# Patient Record
Sex: Male | Born: 1995 | Race: White | Hispanic: No | Marital: Single | Smoking: Never smoker
Health system: Southern US, Community
[De-identification: ages and names within clinical notes are randomized; demographics above are authoritative.]

---
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
## 2015-12-28 ENCOUNTER — Emergency Department: Payer: BLUE CROSS/BLUE SHIELD

## 2015-12-28 ENCOUNTER — Encounter: Payer: Self-pay | Admitting: Emergency Medicine

## 2015-12-28 ENCOUNTER — Emergency Department
Admission: EM | Admit: 2015-12-28 | Discharge: 2015-12-28 | Disposition: A | Discharge status: Home / Self Care | Payer: BLUE CROSS/BLUE SHIELD | Attending: Emergency Medicine | Admitting: Emergency Medicine

## 2015-12-28 DIAGNOSIS — M25571 Pain in right ankle and joints of right foot: Secondary | ICD-10-CM | POA: Insufficient documentation

## 2015-12-28 NOTE — ED Notes (Signed)
States he developed pain to post right ankle about 2 weeks ago  Denies injury  No swelling or redness noted. Ambulates well to treatment room. Describes pain as sharp and lasting only a short time

## 2015-12-28 NOTE — Discharge Instructions (Signed)
Advised elastic support and [facility] if condition persists.

## 2015-12-28 NOTE — ED Provider Notes (Signed)
[facility] Emergency Department Provider Note   ____________________________________________  Time seen: Approximately 10:01 AM  I have reviewed the triage vital signs and the nursing notes.   HISTORY  Chief Complaint Ankle Pain    HPI Patricia NettleMikel Nowland is a 20 y.o. male patient complaining of right ankle pain for 2 weeks. Patient denies any specific injury. Patient stated pain comes and go. Patient patient states the pain increase ambulation. No palliative measures taken for this complaint. Patient rates his pain as a 6/10. Patient describes pain as intermittent "dull".   History reviewed. No pertinent past medical history.  There are no active problems to display for this patient.   History reviewed. No pertinent past surgical history.  No current outpatient prescriptions on file.  Allergies Review of patient's allergies indicates no known allergies.  History reviewed. No pertinent family history.  Social History Social History  Substance Use Topics  . Smoking status: Never Smoker   . Smokeless tobacco: None  . Alcohol Use: No    Review of Systems Constitutional: No fever/chills Eyes: No visual changes. ENT: No sore throat. Cardiovascular: Denies chest pain. Respiratory: Denies shortness of breath. Gastrointestinal: No abdominal pain.  No nausea, no vomiting.  No diarrhea.  No constipation. Genitourinary: Negative for dysuria. Musculoskeletal: Right ankle pain  Skin: Negative for rash. Neurological: Negative for headaches, focal weakness or numbness.  ve.  ____________________________________________   PHYSICAL EXAM:  VITAL SIGNS: ED Triage Vitals  Enc Vitals Group     BP 12/28/15 0840 111/70 mmHg     Pulse Rate 12/28/15 0840 76     Resp 12/28/15 0840 20     Temp 12/28/15 0840 98.3 F (36.8 C)     Temp Source 12/28/15 0840 Oral     SpO2 12/28/15 0840 98 %     Weight 12/28/15 0840 160 lb (72.576 kg)     Height 12/28/15  0840 5\' 7"  (1.702 m)     Head Cir --      Peak Flow --      Pain Score 12/28/15 0842 6     Pain Loc --      Pain Edu? --      Excl. in GC? --     Constitutional: Alert and oriented. Well appearing and in no acute distress. Eyes: Conjunctivae are normal. PERRL. EOMI. Head: Atraumatic. Nose: No congestion/rhinnorhea. Mouth/Throat: Mucous membranes are moist.  Oropharynx non-erythematous. Neck: No stridor.  No cervical spine tenderness to palpation. Hematological/Lymphatic/Immunilogical: No cervical lymphadenopathy. Cardiovascular: Normal rate, regular rhythm. Grossly normal heart sounds.  Good peripheral circulation. Respiratory: Normal respiratory effort.  No retractions. Lungs CTAB. Gastrointestinal: Soft and nontender. No distention. No abdominal bruits. No CVA tenderness. Musculoskeletal: No obvious deformity of the left ankle. Patient has a large arch. There is no edema or ecchymosis or abrasion. Patient has some moderate guarding palpation of the distal fibular. Neurologic:  Normal speech and language. No gross focal neurologic deficits are appreciated. No gait instability. Skin:  Skin is warm, dry and intact. No rash noted. Psychiatric: Mood and affect are normal. Speech and behavior are normal.  ____________________________________________   LABS (all labs ordered are listed, but only abnormal results are displayed)  Labs Reviewed - No data to display ____________________________________________  EKG   ____________________________________________  RADIOLOGY  No acute findings on x-ray. I, Joni Reiningonald K Chaunce Winkels, personally viewed and evaluated these images (plain radiographs) as part of my medical decision making, as well as reviewing the written report by the radiologist.  ____________________________________________   PROCEDURES  Procedure(s) performed: None  Critical Care performed: No  ____________________________________________   INITIAL IMPRESSION / ASSESSMENT  AND PLAN / ED COURSE  Pertinent labs & imaging results that were available during my care of the patient were reviewed by me and considered in my medical decision making (see chart for details).  Right ankle pain. Discussed negative x-ray finding with patient. Advised patient to purchase and wear an over-the-counter elastic ankle support. Advised [facility]. ____________________________________________   FINAL CLINICAL IMPRESSION(S) / ED DIAGNOSES  Final diagnoses:  Acute right ankle pain      NEW MEDICATIONS STARTED DURING THIS VISIT:  New Prescriptions   No medications on file     Note:  This document was prepared using Dragon voice recognition software and may include unintentional dictation errors.    Joni Reining, PA-C 12/28/15 1006  Governor Rooks, MD 12/28/15 1350

## 2015-12-28 NOTE — ED Notes (Signed)
Pt reports ankle pain x 2 weeks intermittently. Pt denies injury or trauma. Pt walks with steady gait, no difficulty noted.

## 2017-10-16 IMAGING — DX DG ANKLE COMPLETE 3+V*R*
3 series · 3 of 3 positions shown · non-contrast
Comparison: None.

CLINICAL DATA: Intermittent right ankle pain for 1-2 months.
Plantar foot pain. No known injury. Initial encounter.

EXAM:
RIGHT ANKLE - COMPLETE 3+ VIEW

[ankle ap]
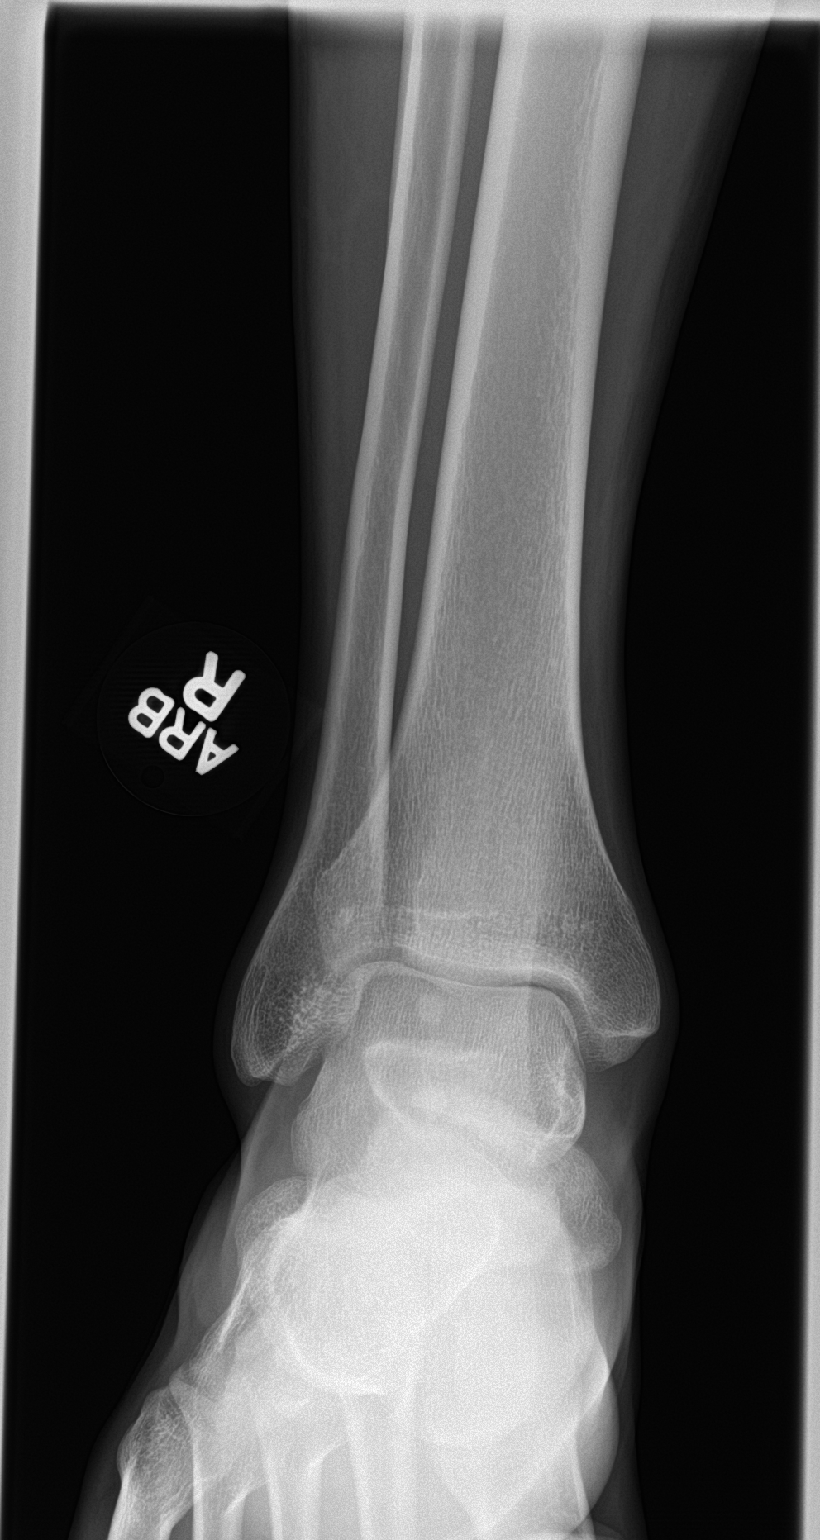

[ankle obl]
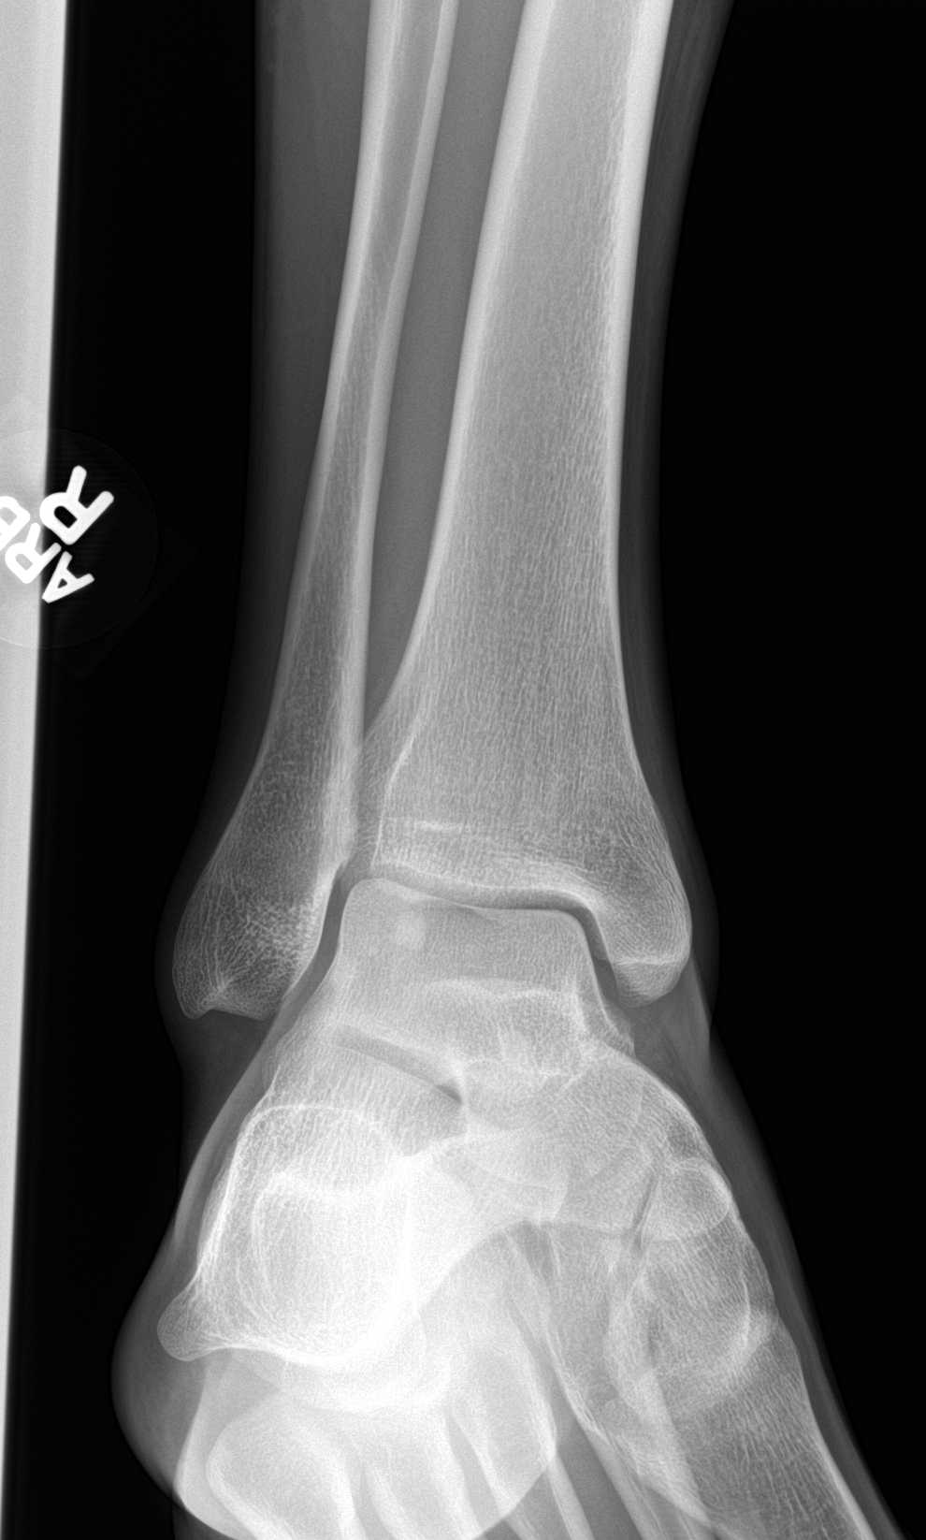

[ankle lat]
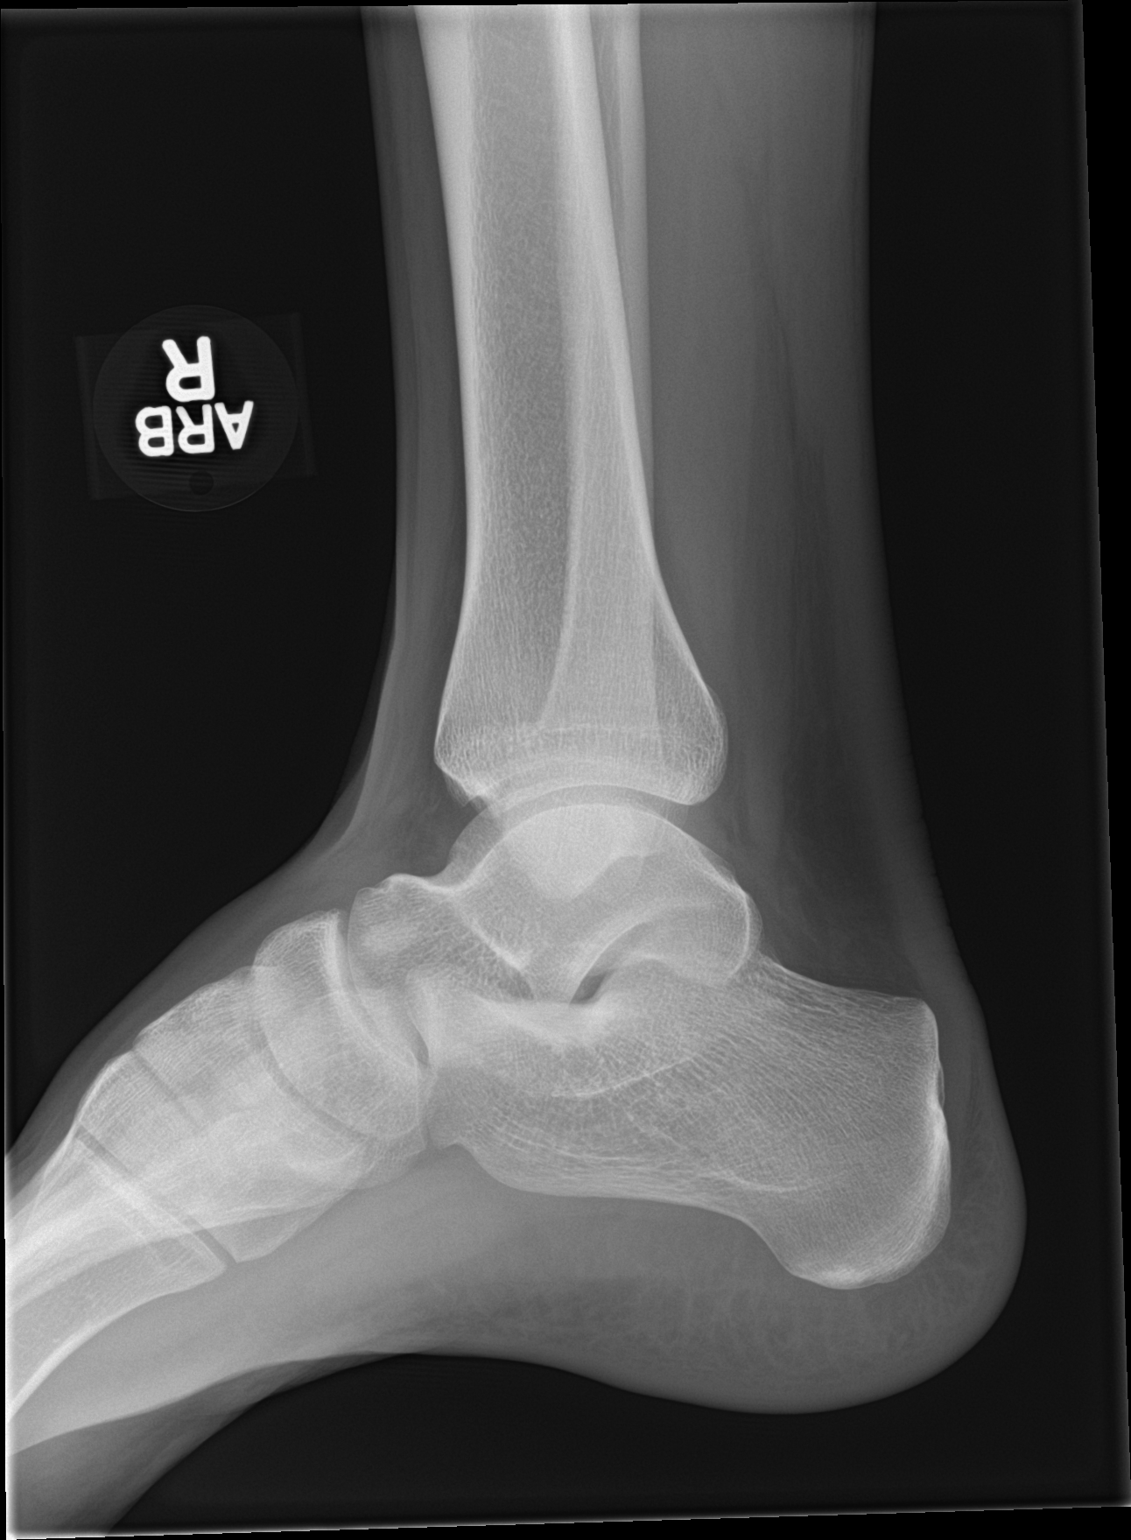

[3 of 3 positions shown; findings below may reference images not displayed]

FINDINGS: There is no evidence of fracture, dislocation, or joint effusion.
There is no evidence of arthropathy. Small sclerotic lesion in the
lateral talus most consistent with a bone island. Soft tissues are
unremarkable.
IMPRESSION: Negative exam.

## 2022-05-17 ENCOUNTER — Encounter: Payer: Self-pay | Admitting: Urology

## 2022-05-17 ENCOUNTER — Ambulatory Visit (INDEPENDENT_AMBULATORY_CARE_PROVIDER_SITE_OTHER): Payer: Self-pay | Admitting: Urology

## 2022-05-17 ENCOUNTER — Other Ambulatory Visit: Payer: Self-pay | Admitting: *Deleted

## 2022-05-17 ENCOUNTER — Other Ambulatory Visit
Admission: RE | Admit: 2022-05-17 | Discharge: 2022-05-17 | Disposition: A | Discharge status: Home / Self Care | Payer: BLUE CROSS/BLUE SHIELD | Attending: Urology | Admitting: Urology

## 2022-05-17 VITALS — BP 113/76 | HR 78 | Ht 68.0 in | Wt 152.0 lb

## 2022-05-17 DIAGNOSIS — N410 Acute prostatitis: Secondary | ICD-10-CM

## 2022-05-17 DIAGNOSIS — R361 Hematospermia: Secondary | ICD-10-CM

## 2022-05-17 DIAGNOSIS — R102 Pelvic and perineal pain: Secondary | ICD-10-CM

## 2022-05-17 LAB — URINALYSIS, COMPLETE (UACMP) WITH MICROSCOPIC
Bacteria, UA: NONE SEEN
Bilirubin Urine: NEGATIVE
Glucose, UA: NEGATIVE mg/dL
Hgb urine dipstick: NEGATIVE
Ketones, ur: NEGATIVE mg/dL
Leukocytes,Ua: NEGATIVE
Nitrite: NEGATIVE
Protein, ur: NEGATIVE mg/dL
RBC / HPF: NONE SEEN RBC/hpf (ref 0–5)
Specific Gravity, Urine: 1.01 (ref 1.005–1.030)
Squamous Epithelial / HPF: NONE SEEN (ref 0–5)
WBC, UA: NONE SEEN WBC/hpf (ref 0–5)
pH: 7 (ref 5.0–8.0)

## 2022-05-17 MED ORDER — CELECOXIB 200 MG PO CAPS: 200.0000 mg | ORAL_CAPSULE | Freq: Two times a day (BID) | ORAL | 0 refills | Status: AC

## 2022-05-17 NOTE — Progress Notes (Signed)
   05/17/22 10:37 AM   Levada Dy Sabra Heck 08/20/1996 [id number]  CC: Left groin pain, hematospermia, possible prostatitis  HPI: I saw Mr. Nuon today as an urgent add-on in clinic for the above issues.  He was seen at urgent care about 2 weeks ago with primary complaint of left-sided groin pain as well as 1 episode of painless hematospermia, and started on Cipro for possible prostatitis.  Those records are not available to me, does not sound like his urinalysis was suspicious for infection at that time.  His primary complaint today is left-sided groin and inner leg pain.  He denies any testicular pain, no dysuria, no urinary symptoms.  Hematospermia has since resolved.  He has not been taking the Cipro routinely, he does feel like things have improved somewhat in the last few days.  His pain seems to be with prolonged standing or movement.  Social History:  reports that he has never smoked. He has never been exposed to tobacco smoke. He has never used smokeless tobacco. He reports that he does not drink alcohol and does not use drugs.  Physical Exam: BP 113/76   Pulse 78   Ht 5\' 8"  (1.727 m)   Wt 152 lb (68.9 kg)   BMI 23.11 kg/m    Constitutional:  Alert and oriented, No acute distress. Cardiovascular: No clubbing, cyanosis, or edema. Respiratory: Normal respiratory effort, no increased work of breathing. GI: Abdomen is soft, nontender, nondistended, no abdominal masses GU: Testicles 20 cc and descended bilaterally without masses, no testicular tenderness, left groin mildly tender  Laboratory Data: Reviewed, urinalysis today benign  Assessment & Plan:   26 year old male with a few weeks of left-sided groin pain.  No urinary symptoms, no testicular discomfort, and benign urinalysis.  No evidence of infectious cause, and I think this is much more likely to be related to a groin musculoskeletal issue or pelvic floor dysfunction.  We reviewed hematospermia is typically a benign process, no  further evaluation or work-up is needed.  I recommended a trial of Celebrex 200 mg twice daily x1 week and pelvic floor stretching exercises given.  Celebrex 200 mg twice daily x1 week Pelvic floor stretching exercises and strategies reviewed Follow-up urology as needed  Nickolas Madrid, MD 05/17/2022  [facility] [address], [place], [place] [address] [phone]
